# Patient Record
Sex: Female | Born: 1940 | Hispanic: No | Marital: Married
Health system: Southern US, Community
[De-identification: ages and names within clinical notes are randomized; demographics above are authoritative.]

---

## 2016-03-21 ENCOUNTER — Other Ambulatory Visit (HOSPITAL_COMMUNITY): Payer: Self-pay | Admitting: Interventional Radiology

## 2016-03-21 DIAGNOSIS — I82402 Acute embolism and thrombosis of unspecified deep veins of left lower extremity: Secondary | ICD-10-CM

## 2016-04-24 ENCOUNTER — Ambulatory Visit
Admission: RE | Admit: 2016-04-24 | Discharge: 2016-04-24 | Disposition: A | Discharge status: Home / Self Care | Payer: No Typology Code available for payment source | Source: Ambulatory Visit | Attending: Interventional Radiology | Admitting: Interventional Radiology

## 2016-04-24 ENCOUNTER — Ambulatory Visit
Admission: RE | Admit: 2016-04-24 | Discharge: 2016-04-24 | Disposition: A | Discharge status: Home / Self Care | Payer: Self-pay | Source: Ambulatory Visit | Attending: Interventional Radiology | Admitting: Interventional Radiology

## 2016-04-24 DIAGNOSIS — I82402 Acute embolism and thrombosis of unspecified deep veins of left lower extremity: Secondary | ICD-10-CM

## 2016-04-24 NOTE — Progress Notes (Signed)
Patient ID: Courtney Pollard, female   DOB: April 08, 1941, 75 y.o.   MRN: [id number]   Referring Physician(s): Courtney Pollard   Chief Complaint: The patient is seen in follow up today s/p DVT lysis of the LLE due to DVT of ileofemoral system  History of present illness:  This is a 75 yo [place] female who is present here today with her daughter who translates for her.  She underwent a DVT lysis by Dr. Loreta AveWagner and was finished by Dr. Miles CostainShick on June 3-4.  She was started on Xarelto and has taken this the last month; however, she is being transitioned to Eliquis due to cost.  She used her one month coupon and the pharmacy will accept this no more than one month.  She will use her Eliquis coupon and then she is being set up with a payer assist program to help her continue to be able to afford her anticoagulation for the full six month time frame.    She states she feels well.  She occasionally gets some swelling in both of her ankles when she stands for long periods of time, but denies any other pain or difficulty ambulating.  She presents today for a follow up visit, s/p DVT lysis.  No past medical history on file.  No past surgical history on file.  Allergies: Review of patient's allergies indicates not on file.  Medications: Prior to Admission medications   Not on File     No family history on file.  Social History   Social History  . Marital Status: Married    Spouse Name: N/A  . Number of Children: N/A  . Years of Education: N/A   Social History Main Topics  . Smoking status: Not on file  . Smokeless tobacco: Not on file  . Alcohol Use: Not on file  . Drug Use: Not on file  . Sexual Activity: Not on file   Other Topics Concern  . Not on file   Social History Narrative  . No narrative on file     Vital Signs: There were no vitals taken for this visit.  Physical Exam Gen: pleasant, WD, WN, black female in NAD Heart: regular Lungs: CTAB Ext: bilateral lower  extremities with no evidence of edema currently.  She has +2 palpable pedal pulses bilaterally  Imaging: [place] Venous Img Lower Unilateral Left  04/24/2016  CLINICAL DATA:  Acute left lower extremity DVT, May-Thurner syndrome, 1 month status post left lower extremity DVT thrombo lysis and left common iliac venous stent insertion. EXAM: LEFT LOWER EXTREMITY VENOUS DOPPLER ULTRASOUND TECHNIQUE: Gray-scale sonography with graded compression, as well as color Doppler and duplex ultrasound were performed to evaluate the lower extremity deep venous systems from the level of the common femoral vein and including the common femoral, femoral, profunda femoral, popliteal and calf veins including the posterior tibial, peroneal and gastrocnemius veins when visible. The superficial great saphenous vein was also interrogated. Spectral Doppler was utilized to evaluate flow at rest and with distal augmentation maneuvers in the common femoral, femoral and popliteal veins. COMPARISON:  None. FINDINGS: Contralateral Common Femoral Vein: Respiratory phasicity is normal and symmetric with the symptomatic side. No evidence of thrombus. Normal compressibility. Common Femoral Vein: No evidence of thrombus. Normal compressibility, respiratory phasicity and response to augmentation. Saphenofemoral Junction: No evidence of thrombus. Normal compressibility and flow on color Doppler imaging. Profunda Femoral Vein: No evidence of thrombus. Normal compressibility and flow on color Doppler imaging. Femoral Vein: No evidence of thrombus.  Normal compressibility, respiratory phasicity and response to augmentation. Popliteal Vein: No evidence of thrombus. Normal compressibility, respiratory phasicity and response to augmentation. Calf Veins: No evidence of thrombus. Normal compressibility and flow on color Doppler imaging. Superficial Great Saphenous Vein: No evidence of thrombus. Normal compressibility and flow on color Doppler imaging. Venous  Reflux:  None. Other Findings:  None. IMPRESSION: No evidence of deep venous thrombosis. Electronically Signed   By: Judie Petit.  Shick M.D.   On: 04/24/2016 09:53    Labs:  CBC: No results for input(s): WBC, HGB, HCT, PLT in the last 8760 hours.  COAGS: No results for input(s): INR, APTT in the last 8760 hours.  BMP: No results for input(s): NA, K, CL, CO2, GLUCOSE, BUN, CALCIUM, CREATININE, GFRNONAA, GFRAA in the last 8760 hours.  Invalid input(s): CMP  LIVER FUNCTION TESTS: No results for input(s): BILITOT, AST, ALT, ALKPHOS, PROT, ALBUMIN in the last 8760 hours.  Assessment:  1. S/p DVT lysis on March 17, 2016  -The patient is doing very well.  She is still on her Xarelto, but this is her final day.  She is going to transition to Eliquis, per her PCP.  Once she is approved for the payer assist program through her PCP's office, then they will be able to help her continue on her anticoagulation to complete her 6 month course.  Once this is completed, she will need to be on life-long ASA .  This was told to the daughter who translated for the patient.  They understand.  No need for further follow up unless problems or questions arise.  Signed: Letha Cape 04/24/2016, 10:09 AM   Please refer to Dr. Chester Holstein attestation of this note for management and plan.

## 2016-04-24 NOTE — Progress Notes (Signed)
Patient ID: Marcie MowersManilla Ryser, female   DOB: 1941/05/17, 75 y.o.   MRN: [id number] I reviewed the history and physical examination and have formulated the assessment and plan in the presence of the patient.  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 She has been on eloquis for one month as an outpatient. Left lower extremity is now asymptomatic. Ultrasound today confirms no evidence of residual DVT. Clinically she is doing very well.  Plan for 6 months of anticoagulation with Eloquis. After 6 months, continue 325 mg aspirin for life.  A copy of this report was sent to the requesting provider on this date.  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

## 2017-09-19 IMAGING — US US EXTREM LOW VENOUS*L*
1 series · 13 of 24 positions shown · non-contrast
Comparison: None.

CLINICAL DATA: Acute left lower extremity DVT, Uatix Cool
syndrome, 1 month status post left lower extremity DVT thrombo lysis
and left common iliac venous stent insertion.



[Series 1: us extrem low venous*left* · 13 of 34 slices shown]
[im 1/34]
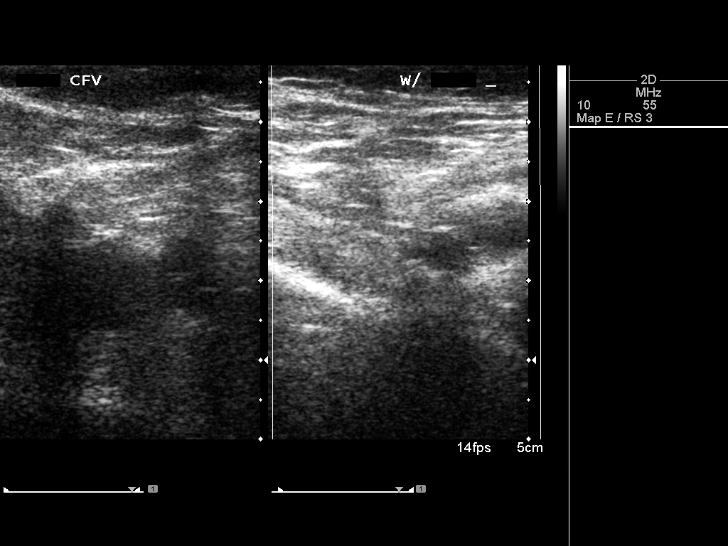
[im 3/34]
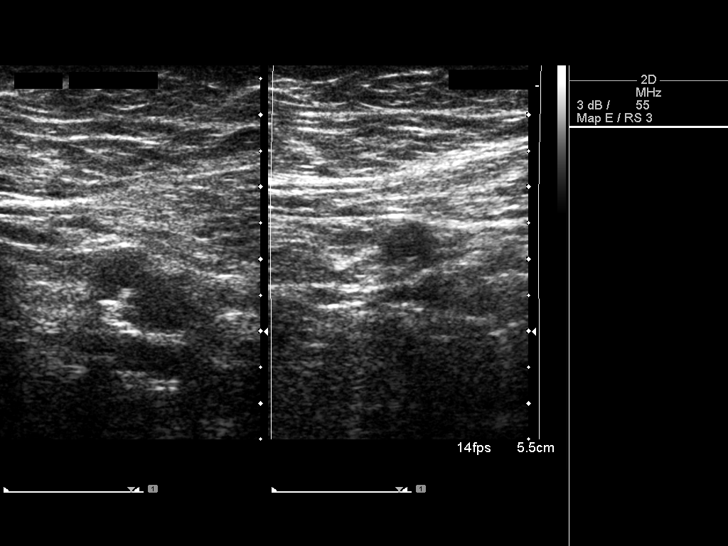
[im 6/34]
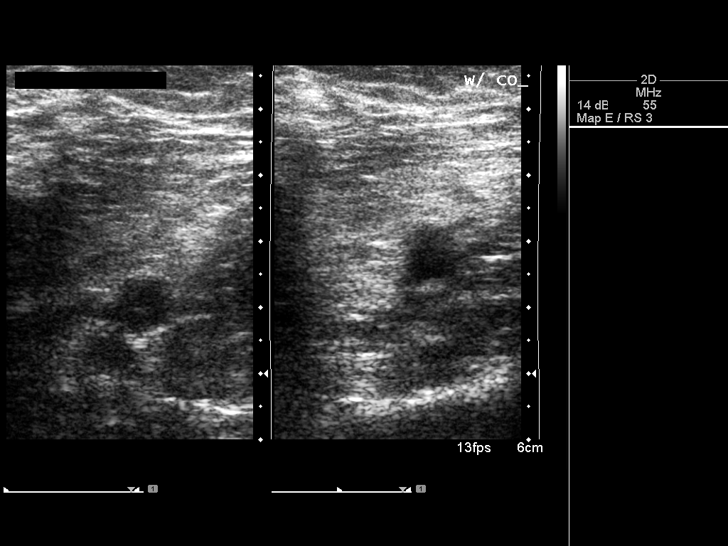
[im 9/34]
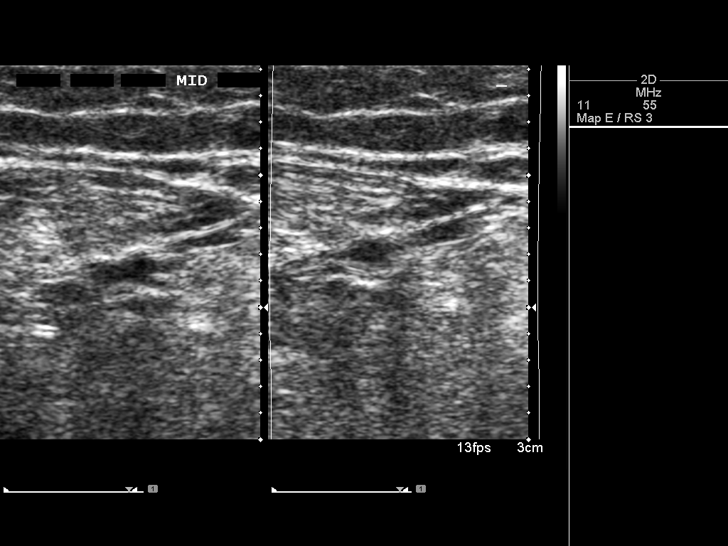
[im 12/34]
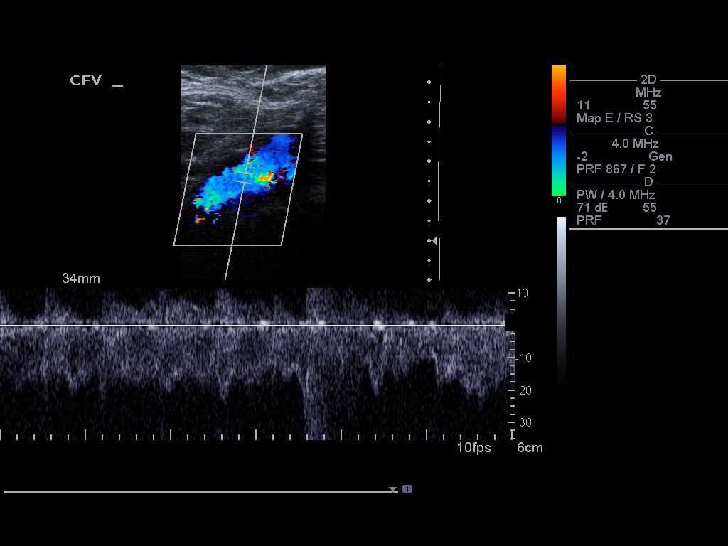
[im 15/34]
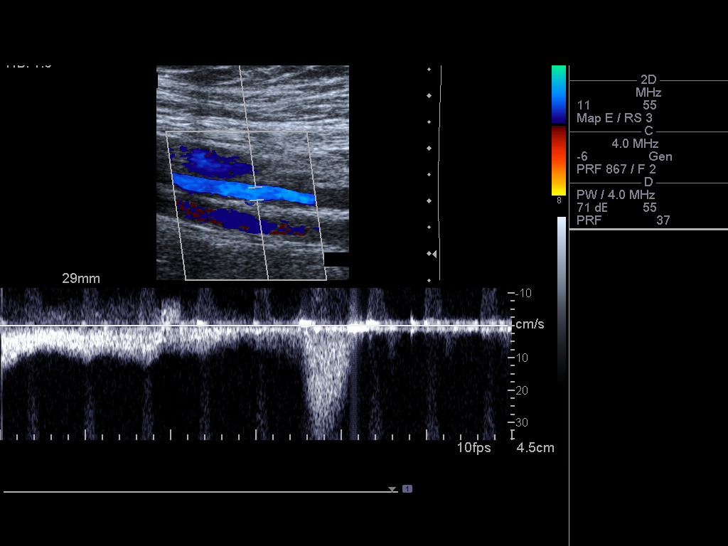
[im 18/34]
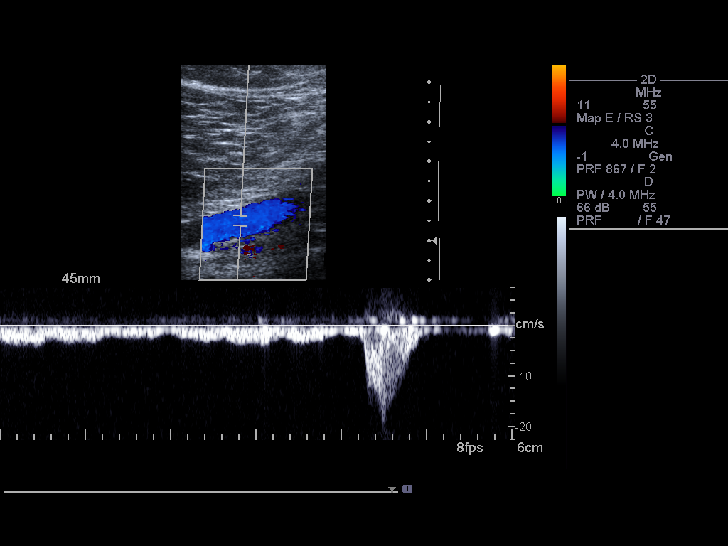
[im 19/34]
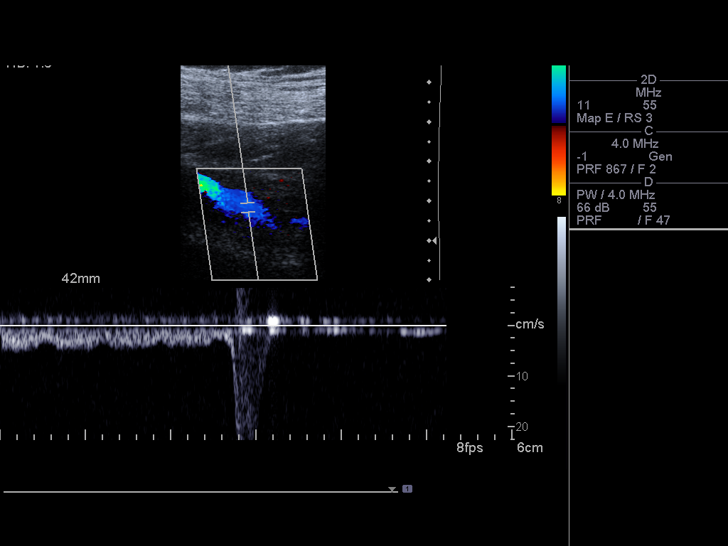
[im 22/34]
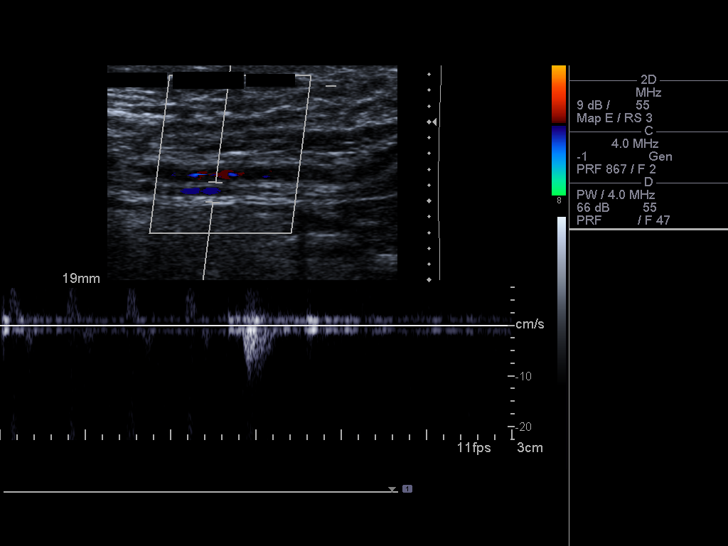
[im 25/34]
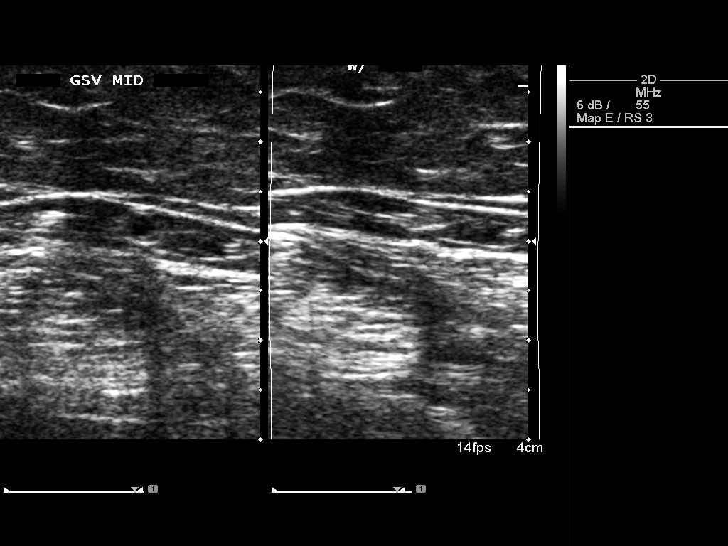
[im 28/34]
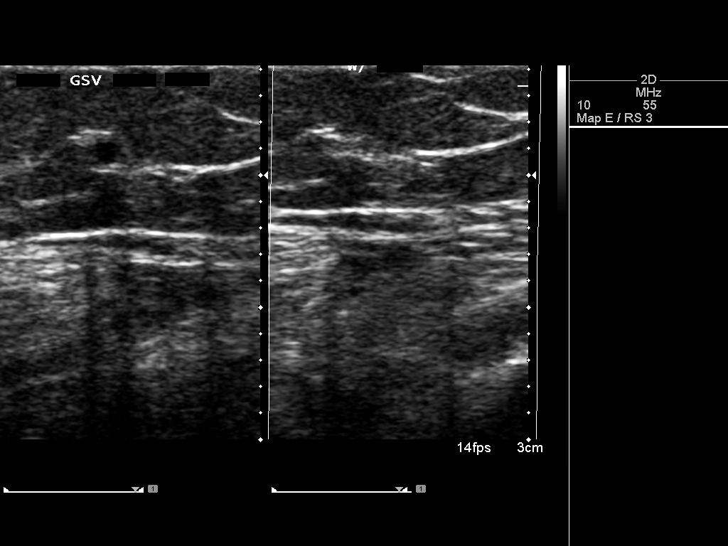
[im 31/34]
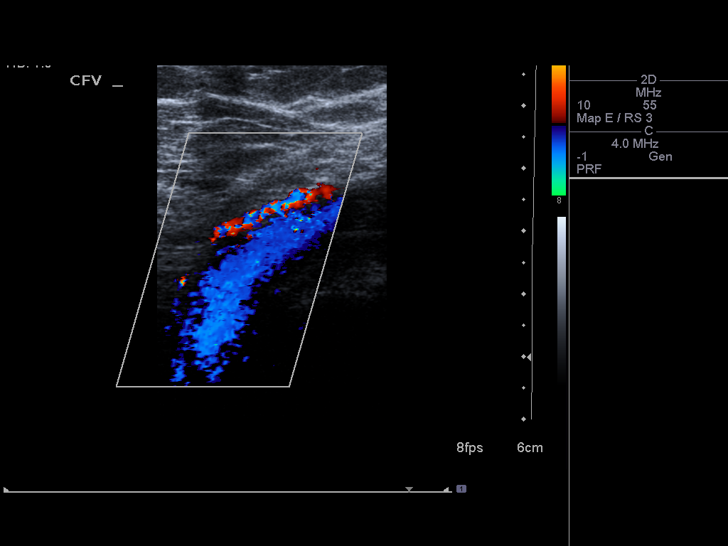
[im 34/34]
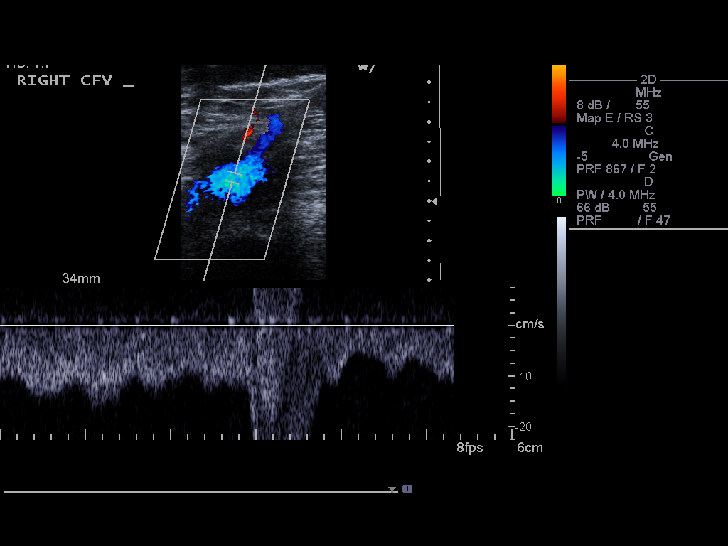

[13 of 24 positions shown; findings below may reference images not displayed]

FINDINGS: Contralateral Common Femoral Vein: Respiratory phasicity is normal
and symmetric with the symptomatic side. No evidence of thrombus.
Normal compressibility.

Common Femoral Vein: No evidence of thrombus. Normal
compressibility, respiratory phasicity and response to augmentation.

Saphenofemoral Junction: No evidence of thrombus. Normal
compressibility and flow on color Doppler imaging.

Profunda Femoral Vein: No evidence of thrombus. Normal
compressibility and flow on color Doppler imaging.

Femoral Vein: No evidence of thrombus. Normal compressibility,
respiratory phasicity and response to augmentation.

Popliteal Vein: No evidence of thrombus. Normal compressibility,
respiratory phasicity and response to augmentation.

Calf Veins: No evidence of thrombus. Normal compressibility and flow
on color Doppler imaging.

Superficial Great Saphenous Vein: No evidence of thrombus. Normal
compressibility and flow on color Doppler imaging.

Venous Reflux:  None.

Other Findings:  None.
IMPRESSION: No evidence of deep venous thrombosis.
